# Patient Record
Sex: Male | Born: 1995 | Race: Black or African American | Hispanic: No | Marital: Single | Smoking: Never smoker
Health system: Southern US, Community
[De-identification: ages and names within clinical notes are randomized; demographics above are authoritative.]

---
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
## 2014-03-29 ENCOUNTER — Ambulatory Visit: Payer: BC Managed Care – PPO | Admitting: Physician Assistant

## 2014-03-29 VITALS — BP 120/82 | HR 83 | Temp 98.6°F | Resp 16 | Ht 70.0 in | Wt 177.8 lb

## 2014-03-29 DIAGNOSIS — J029 Acute pharyngitis, unspecified: Secondary | ICD-10-CM

## 2014-03-29 DIAGNOSIS — D72829 Elevated white blood cell count, unspecified: Secondary | ICD-10-CM

## 2014-03-29 LAB — POCT CBC
GRANULOCYTE PERCENT: 76.1 % (ref 37–80)
HCT, POC: 45.7 % (ref 43.5–53.7)
Hemoglobin: 14.9 g/dL (ref 14.1–18.1)
Lymph, poc: 2 (ref 0.6–3.4)
MCH, POC: 29 pg (ref 27–31.2)
MCHC: 32.6 g/dL (ref 31.8–35.4)
MCV: 89.1 fL (ref 80–97)
MID (CBC): 0.7 (ref 0–0.9)
MPV: 7.9 fL (ref 0–99.8)
PLATELET COUNT, POC: 215 10*3/uL (ref 142–424)
POC GRANULOCYTE: 8.5 — AB (ref 2–6.9)
POC LYMPH PERCENT: 17.9 %L (ref 10–50)
POC MID %: 6 % (ref 0–12)
RBC: 5.13 M/uL (ref 4.69–6.13)
RDW, POC: 13.6 %
WBC: 11.2 10*3/uL — AB (ref 4.6–10.2)

## 2014-03-29 LAB — POCT RAPID STREP A (OFFICE): Rapid Strep A Screen: NEGATIVE

## 2014-03-29 MED ORDER — PENICILLIN G BENZATHINE 1200000 UNIT/2ML IM SUSP
1.2000 10*6.[IU] | Freq: Once | INTRAMUSCULAR | Status: AC
  Administered 2014-03-29: 1.2 10*6.[IU] via INTRAMUSCULAR

## 2014-03-29 NOTE — Progress Notes (Signed)
Subjective:    Patient ID: Henry Daniels, male    DOB: 01-02-1996, 18 y.o.   MRN: [id number]  HPI   Henry Daniels is a very pleasant 18 yr old male here with concern for illness.  Reports that his throat began hurting 2 days ago.  He denies associated URI symptoms.  He denies cough.  May have had a fever, but didn't check his temp.  Afebrile today.  He does have a HA.  No abd pain, NV.  +painful swallowing but no difficulty swallowing.  No voice change.  Pain is worse on the LEFT side.  No known sick contacts.    Pt has shoulder surgery upcoming on 5/28 for LEFT labral tear. Wasn't sure what medication he could take for his throat due to this   Review of Systems  Constitutional: Negative for fever and chills.  HENT: Positive for sore throat. Negative for congestion, ear pain and rhinorrhea.   Respiratory: Negative for cough, shortness of breath and wheezing.   Cardiovascular: Negative.   Gastrointestinal: Negative.   Musculoskeletal: Negative.   Skin: Negative.   Neurological: Positive for headaches.       Objective:   Physical Exam  Vitals reviewed. Constitutional: He is oriented to person, place, and time. He appears well-developed and well-nourished. No distress.  HENT:  Head: Normocephalic and atraumatic.  Right Ear: Tympanic membrane and ear canal normal.  Left Ear: Tympanic membrane and ear canal normal.  Mouth/Throat: Uvula is midline and mucous membranes are normal. Posterior oropharyngeal edema and posterior oropharyngeal erythema present. No oropharyngeal exudate or tonsillar abscesses.  Eyes: Conjunctivae are normal. No scleral icterus.  Neck: Neck supple.  Cardiovascular: Normal rate, regular rhythm and normal heart sounds.   Pulmonary/Chest: Effort normal and breath sounds normal. He has no wheezes. He has no rales.  Abdominal: Soft. There is no tenderness.  Lymphadenopathy:    He has cervical adenopathy.  Neurological: He is alert and oriented to person, place,  and time.  Skin: Skin is warm and dry.  Psychiatric: He has a normal mood and affect.    Results for orders placed in visit on 03/29/14  POCT CBC      Result Value Ref Range   WBC 11.2 (*) 4.6 - 10.2 K/uL   Lymph, poc 2.0  0.6 - 3.4   POC LYMPH PERCENT 17.9  10 - 50 %L   MID (cbc) 0.7  0 - 0.9   POC MID % 6.0  0 - 12 %M   POC Granulocyte 8.5 (*) 2 - 6.9   Granulocyte percent 76.1  37 - 80 %G   RBC 5.13  4.69 - 6.13 M/uL   Hemoglobin 14.9  14.1 - 18.1 g/dL   HCT, POC 88.8  28.0 - 53.7 %   MCV 89.1  80 - 97 fL   MCH, POC 29.0  27 - 31.2 pg   MCHC 32.6  31.8 - 35.4 g/dL   RDW, POC 03.4     Platelet Count, POC 215  142 - 424 K/uL   MPV 7.9  0 - 99.8 fL  POCT RAPID STREP A (OFFICE)      Result Value Ref Range   Rapid Strep A Screen Negative  Negative         Assessment & Plan:  Acute pharyngitis - Plan: POCT CBC, POCT rapid strep A, Culture, Group A Strep, penicillin g benzathine (BICILLIN LA) 1200000 UNIT/2ML injection 1.2 Million Units  Leukocytosis, unspecified - Plan: penicillin g benzathine (  BICILLIN LA) 1200000 UNIT/2ML injection 1.2 Million Units   Henry Daniels is a very pleasant 18 yr old male with acute pharyngitis.  Rapid strep is negative, but he does have a leukocytosis with a shift.  I think it is appropriate to cover him for strep today.  Pt requests injection rather than oral medication.  Bicillin administered in clinic.  Ibuprofen for throat pain.  Push fluids, rest  Pt to call or RTC if worsening or not improving  E. Frances FurbishElizabeth Letonia Stead MHS, PA-C [facility] 5/23/20158:31 AM

## 2014-03-29 NOTE — Patient Instructions (Signed)
Your rapid strep test is negative today, but since your white blood cell count is elevated, I think it's reasonable to treat you for strep today  The injection you were given will cover you for a strep infection  Take ibuprofen 600mg  every 8 hours with food to help with sore throat.    Drink plenty of fluids (water is best!) and get plenty of rest  Strep Throat Strep throat is an infection of the throat caused by a bacteria named Streptococcus pyogenes. Your caregiver may call the infection streptococcal "tonsillitis" or "pharyngitis" depending on whether there are signs of inflammation in the tonsils or back of the throat. Strep throat is most common in children aged 5 15 years during the cold months of the year, but it can occur in people of any age during any season. This infection is spread from person to person (contagious) through coughing, sneezing, or other close contact. SYMPTOMS   Fever or chills.  Painful, swollen, red tonsils or throat.  Pain or difficulty when swallowing.  White or yellow spots on the tonsils or throat.  Swollen, tender lymph nodes or "glands" of the neck or under the jaw.  Red rash all over the body (rare). DIAGNOSIS  Many different infections can cause the same symptoms. A test must be done to confirm the diagnosis so the right treatment can be given. A "rapid strep test" can help your caregiver make the diagnosis in a few minutes. If this test is not available, a light swab of the infected area can be used for a throat culture test. If a throat culture test is done, results are usually available in a day or two. TREATMENT  Strep throat is treated with antibiotic medicine. HOME CARE INSTRUCTIONS   Gargle with 1 tsp of salt in 1 cup of warm water, 3 4 times per day or as needed for comfort.  Family members who also have a sore throat or fever should be tested for strep throat and treated with antibiotics if they have the strep infection.  Make sure  everyone in your household washes their hands well.  Do not share food, drinking cups, or personal items that could cause the infection to spread to others.  You may need to eat a soft food diet until your sore throat gets better.  Drink enough water and fluids to keep your urine clear or pale yellow. This will help prevent dehydration.  Get plenty of rest.  Stay home from school, daycare, or work until you have been on antibiotics for 24 hours.  Only take over-the-counter or prescription medicines for pain, discomfort, or fever as directed by your caregiver.  If antibiotics are prescribed, take them as directed. Finish them even if you start to feel better. SEEK MEDICAL CARE IF:   The glands in your neck continue to enlarge.  You develop a rash, cough, or earache.  You cough up green, yellow-brown, or bloody sputum.  You have pain or discomfort not controlled by medicines.  Your problems seem to be getting worse rather than better. SEEK IMMEDIATE MEDICAL CARE IF:   You develop any new symptoms such as vomiting, severe headache, stiff or painful neck, chest pain, shortness of breath, or trouble swallowing.  You develop severe throat pain, drooling, or changes in your voice.  You develop swelling of the neck, or the skin on the neck becomes red and tender.  You have a fever.  You develop signs of dehydration, such as fatigue, dry mouth, and  decreased urination.  You become increasingly sleepy, or you cannot wake up completely. Document Released: 10/22/2000 Document Revised: 10/11/2012 Document Reviewed: 12/24/2010 [facility] Patient Information 2014 [place], [place].

## 2014-04-01 LAB — CULTURE, GROUP A STREP: ORGANISM ID, BACTERIA: NORMAL

## 2015-01-24 ENCOUNTER — Emergency Department (HOSPITAL_COMMUNITY)
Admission: EM | Admit: 2015-01-24 | Discharge: 2015-01-25 | Disposition: A | Discharge status: Home / Self Care | Payer: No Typology Code available for payment source | Attending: Emergency Medicine | Admitting: Emergency Medicine

## 2015-01-24 ENCOUNTER — Encounter (HOSPITAL_COMMUNITY): Payer: Self-pay | Admitting: *Deleted

## 2015-01-24 DIAGNOSIS — Y9389 Activity, other specified: Secondary | ICD-10-CM | POA: Insufficient documentation

## 2015-01-24 DIAGNOSIS — S8992XA Unspecified injury of left lower leg, initial encounter: Secondary | ICD-10-CM | POA: Insufficient documentation

## 2015-01-24 DIAGNOSIS — S3992XA Unspecified injury of lower back, initial encounter: Secondary | ICD-10-CM | POA: Diagnosis not present

## 2015-01-24 DIAGNOSIS — M25562 Pain in left knee: Secondary | ICD-10-CM

## 2015-01-24 DIAGNOSIS — Y998 Other external cause status: Secondary | ICD-10-CM | POA: Diagnosis not present

## 2015-01-24 DIAGNOSIS — M25512 Pain in left shoulder: Secondary | ICD-10-CM

## 2015-01-24 DIAGNOSIS — S4992XA Unspecified injury of left shoulder and upper arm, initial encounter: Secondary | ICD-10-CM | POA: Insufficient documentation

## 2015-01-24 DIAGNOSIS — Y92410 Unspecified street and highway as the place of occurrence of the external cause: Secondary | ICD-10-CM | POA: Diagnosis not present

## 2015-01-24 DIAGNOSIS — S199XXA Unspecified injury of neck, initial encounter: Secondary | ICD-10-CM | POA: Diagnosis not present
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
## 2015-01-25 ENCOUNTER — Emergency Department (HOSPITAL_COMMUNITY): Payer: No Typology Code available for payment source

## 2015-01-25 MED ORDER — HYDROCODONE-ACETAMINOPHEN 5-325 MG PO TABS
1.0000 | ORAL_TABLET | Freq: Four times a day (QID) | ORAL | Status: AC | PRN
Start: 2015-01-25 — End: ?

## 2015-01-25 NOTE — Discharge Instructions (Signed)
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
